# Patient Record
Sex: Male | Born: 1950 | Race: White | Hispanic: No | Marital: Single | Smoking: Current every day smoker
Health system: Southern US, Community
[De-identification: ages and names within clinical notes are randomized; demographics above are authoritative.]

## PROBLEM LIST (undated history)

## (undated) DIAGNOSIS — I10 Essential (primary) hypertension: Secondary | ICD-10-CM

---

## 1999-11-09 ENCOUNTER — Emergency Department (HOSPITAL_COMMUNITY): Admission: EM | Admit: 1999-11-09 | Discharge: 1999-11-09 | Payer: Self-pay | Admitting: Emergency Medicine

## 2002-09-22 ENCOUNTER — Encounter: Admission: RE | Admit: 2002-09-22 | Discharge: 2002-09-22 | Payer: Self-pay | Admitting: Family Medicine

## 2002-09-22 ENCOUNTER — Encounter: Payer: Self-pay | Admitting: Family Medicine

## 2002-10-24 ENCOUNTER — Encounter: Payer: Self-pay | Admitting: Family Medicine

## 2002-10-27 ENCOUNTER — Encounter: Admission: RE | Admit: 2002-10-27 | Discharge: 2002-10-27 | Payer: Self-pay | Admitting: Family Medicine

## 2002-10-27 ENCOUNTER — Encounter: Payer: Self-pay | Admitting: Family Medicine

## 2004-10-31 ENCOUNTER — Encounter: Admission: RE | Admit: 2004-10-31 | Discharge: 2004-10-31 | Payer: Self-pay | Admitting: Family Medicine

## 2004-10-31 ENCOUNTER — Ambulatory Visit: Payer: Self-pay | Admitting: Family Medicine

## 2004-11-14 ENCOUNTER — Ambulatory Visit: Payer: Self-pay | Admitting: Family Medicine

## 2005-01-17 ENCOUNTER — Ambulatory Visit: Payer: Self-pay | Admitting: Family Medicine

## 2005-02-07 ENCOUNTER — Inpatient Hospital Stay (HOSPITAL_COMMUNITY): Admission: AC | Admit: 2005-02-07 | Discharge: 2005-02-10 | Payer: Self-pay | Admitting: Emergency Medicine

## 2005-05-01 ENCOUNTER — Encounter: Admission: RE | Admit: 2005-05-01 | Discharge: 2005-05-01 | Payer: Self-pay | Admitting: Neurosurgery

## 2006-01-06 IMAGING — CR DG CHEST 2V
3 series · 3 of 3 positions shown · non-contrast
Comparison: none

CLINICAL DATA: Followup apical pleural thickening.  
 CHEST ? TWO VIEW:
 PA and lateral views of the chest are made and are compared to previous CT scans of 10/27/02 and a chest film of 09/22/02 and shows stable bilateral apical scarring which again is seen to be more marked on the right than the left.  There is diffuse peribronchial thickening.  The heart, mediastinum bony thorax and soft tissues are within the limits of normal.

[view not recorded (1 of 3)]
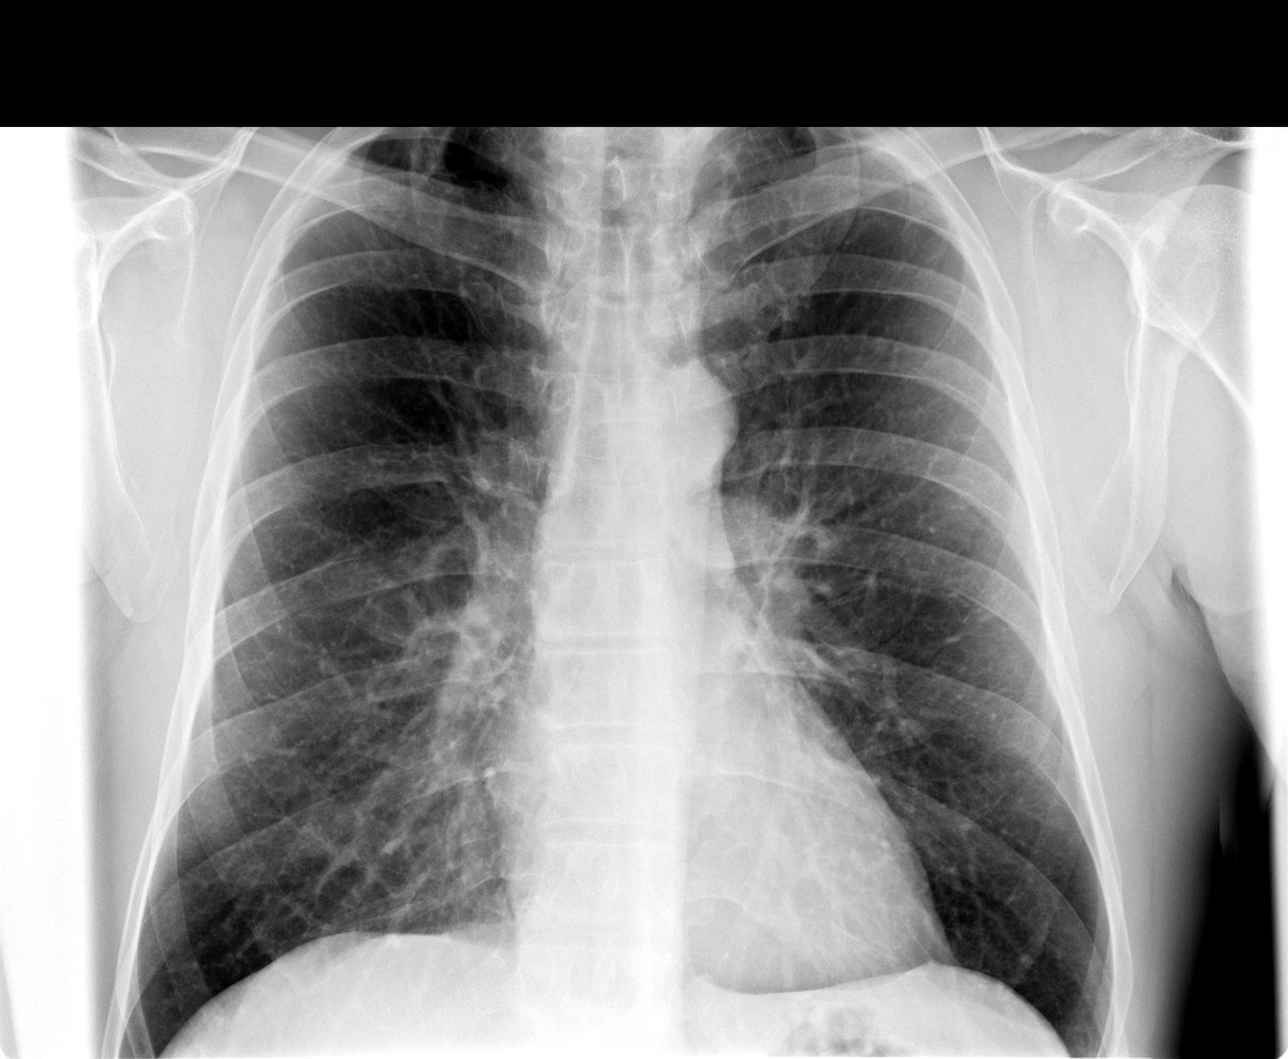

[view not recorded (2 of 3)]
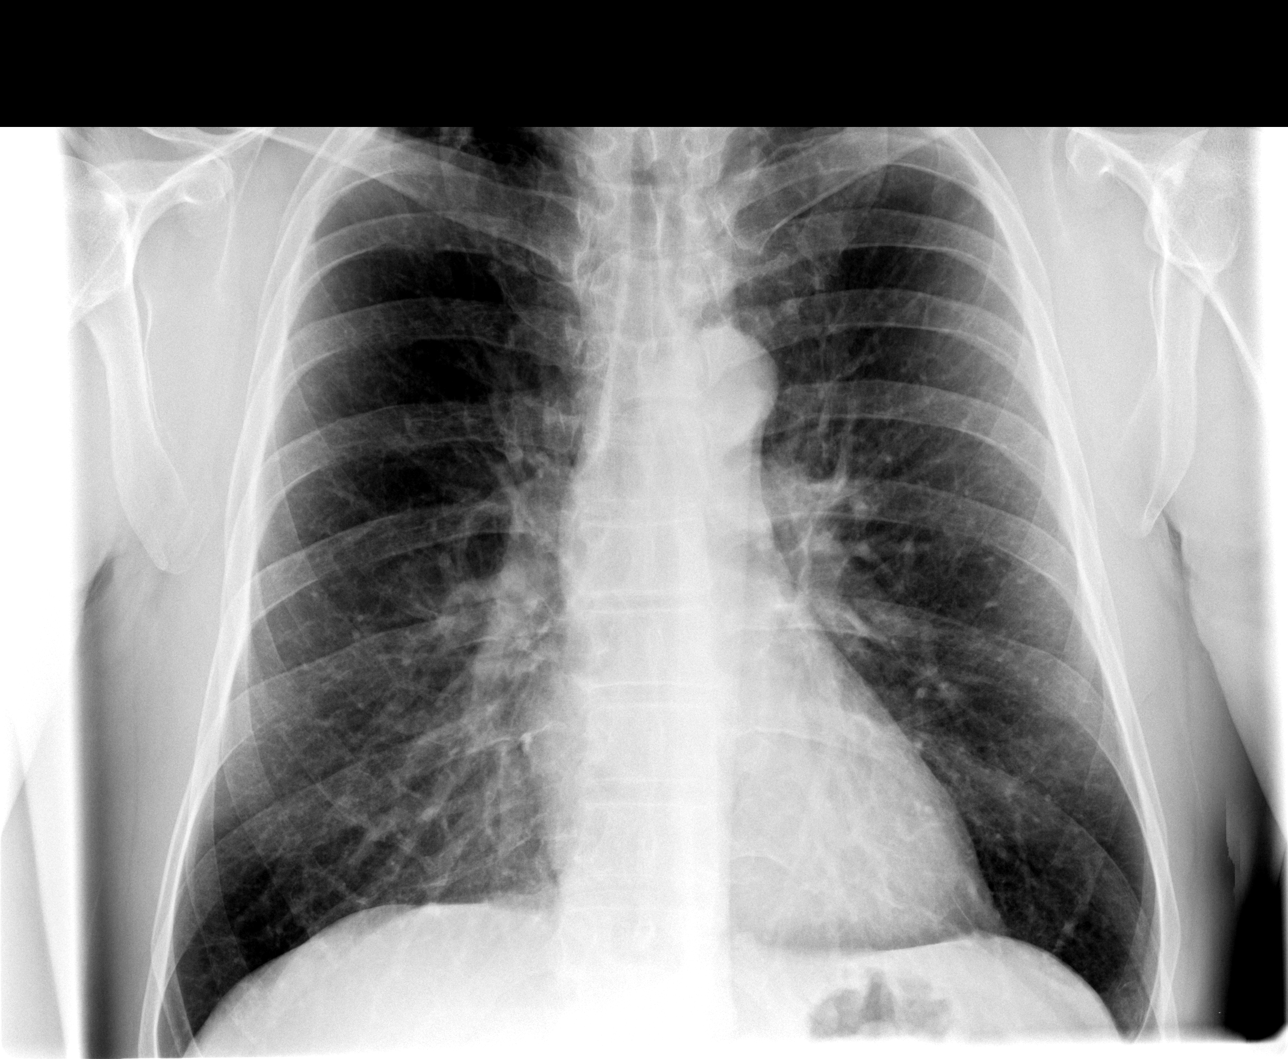

[view not recorded (3 of 3)]
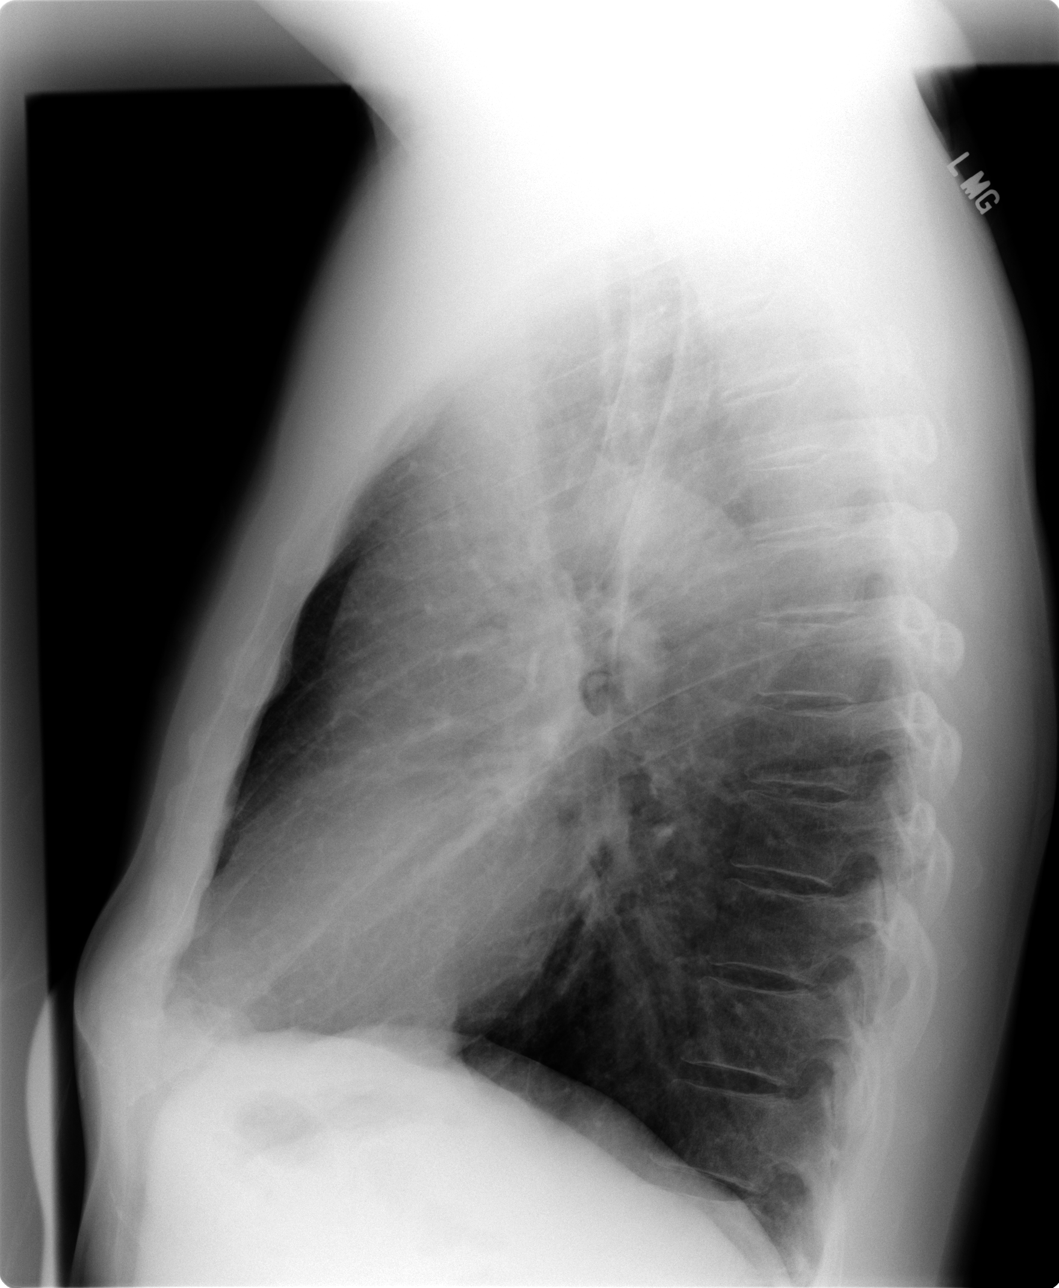

[3 of 3 positions shown; findings below may reference images not displayed]

IMPRESSION: Bilateral apical scarring right greater than left but unchanged since previous studies.  Diffuse peribronchial thickening.  Heart is normal.

## 2006-01-29 ENCOUNTER — Ambulatory Visit: Payer: Self-pay | Admitting: Family Medicine
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
## 2007-01-23 ENCOUNTER — Ambulatory Visit: Payer: Self-pay | Admitting: Family Medicine

## 2007-01-23 LAB — CONVERTED CEMR LAB
Albumin: 3.7 g/dL (ref 3.5–5.2)
Anti Nuclear Antibody(ANA): NEGATIVE
Basophils Relative: 0.9 % (ref 0.0–1.0)
Bilirubin, Direct: 0.1 mg/dL (ref 0.0–0.3)
CO2: 30 meq/L (ref 19–32)
Chloride: 105 meq/L (ref 96–112)
Folate: 8.4 ng/mL
Glucose, Bld: 94 mg/dL (ref 70–99)
HCT: 49.2 % (ref 39.0–52.0)
Hemoglobin: 17 g/dL (ref 13.0–17.0)
Lymphocytes Relative: 24.4 % (ref 12.0–46.0)
MCHC: 34.6 g/dL (ref 30.0–36.0)
Monocytes Relative: 10.4 % (ref 3.0–11.0)
Neutro Abs: 4.4 10*3/uL (ref 1.4–7.7)
Phosphorus: 3.5 mg/dL (ref 2.3–4.6)
RDW: 12.7 % (ref 11.5–14.6)
Total Bilirubin: 0.6 mg/dL (ref 0.3–1.2)
Vitamin B-12: 267 pg/mL (ref 211–911)

## 2007-02-01 ENCOUNTER — Encounter: Payer: Self-pay | Admitting: Family Medicine

## 2007-02-01 DIAGNOSIS — IMO0001 Reserved for inherently not codable concepts without codable children: Secondary | ICD-10-CM

## 2007-02-01 DIAGNOSIS — I10 Essential (primary) hypertension: Secondary | ICD-10-CM | POA: Insufficient documentation

## 2007-02-01 DIAGNOSIS — F1010 Alcohol abuse, uncomplicated: Secondary | ICD-10-CM | POA: Insufficient documentation

## 2007-02-01 DIAGNOSIS — F411 Generalized anxiety disorder: Secondary | ICD-10-CM | POA: Insufficient documentation

## 2007-02-01 DIAGNOSIS — M7020 Olecranon bursitis, unspecified elbow: Secondary | ICD-10-CM | POA: Insufficient documentation

## 2007-02-11 ENCOUNTER — Ambulatory Visit: Payer: Self-pay | Admitting: Family Medicine

## 2007-02-11 DIAGNOSIS — F17200 Nicotine dependence, unspecified, uncomplicated: Secondary | ICD-10-CM

## 2007-02-11 DIAGNOSIS — F341 Dysthymic disorder: Secondary | ICD-10-CM | POA: Insufficient documentation

## 2009-01-25 ENCOUNTER — Ambulatory Visit: Payer: Self-pay | Admitting: Family Medicine

## 2009-01-25 DIAGNOSIS — J309 Allergic rhinitis, unspecified: Secondary | ICD-10-CM | POA: Insufficient documentation

## 2009-01-29 ENCOUNTER — Ambulatory Visit: Payer: Self-pay | Admitting: Family Medicine

## 2009-04-29 ENCOUNTER — Ambulatory Visit: Payer: Self-pay | Admitting: Family Medicine

## 2009-05-03 ENCOUNTER — Ambulatory Visit: Payer: Self-pay | Admitting: Family Medicine

## 2009-05-05 LAB — CONVERTED CEMR LAB
AST: 37 units/L (ref 0–37)
Albumin: 3.8 g/dL (ref 3.5–5.2)
BUN: 11 mg/dL (ref 6–23)
CO2: 30 meq/L (ref 19–32)
Calcium: 9 mg/dL (ref 8.4–10.5)
Cholesterol: 147 mg/dL (ref 0–200)
Eosinophils Absolute: 0.3 10*3/uL (ref 0.0–0.7)
Eosinophils Relative: 4.4 % (ref 0.0–5.0)
Glucose, Bld: 93 mg/dL (ref 70–99)
HCT: 51.1 % (ref 39.0–52.0)
Hemoglobin: 17.2 g/dL — ABNORMAL HIGH (ref 13.0–17.0)
LDL Cholesterol: 97 mg/dL (ref 0–99)
Lymphocytes Relative: 26.9 % (ref 12.0–46.0)
MCV: 93.7 fL (ref 78.0–100.0)
Monocytes Absolute: 0.7 10*3/uL (ref 0.1–1.0)
Monocytes Relative: 10.6 % (ref 3.0–12.0)
Neutrophils Relative %: 56.8 % (ref 43.0–77.0)
Potassium: 4.9 meq/L (ref 3.5–5.1)
RDW: 12.8 % (ref 11.5–14.6)
Sodium: 141 meq/L (ref 135–145)
Total CHOL/HDL Ratio: 4
Total Protein: 7.8 g/dL (ref 6.0–8.3)
VLDL: 13.6 mg/dL (ref 0.0–40.0)

## 2009-05-14 ENCOUNTER — Ambulatory Visit: Payer: Self-pay | Admitting: Family Medicine

## 2009-05-14 LAB — CONVERTED CEMR LAB
OCCULT 2: NEGATIVE
OCCULT 3: NEGATIVE

## 2009-05-17 ENCOUNTER — Encounter (INDEPENDENT_AMBULATORY_CARE_PROVIDER_SITE_OTHER): Payer: Self-pay | Admitting: *Deleted

## 2009-07-13 ENCOUNTER — Ambulatory Visit: Payer: Self-pay | Admitting: Family Medicine

## 2009-08-24 ENCOUNTER — Ambulatory Visit: Payer: Self-pay | Admitting: Family Medicine

## 2009-11-18 ENCOUNTER — Encounter: Payer: Self-pay | Admitting: Family Medicine

## 2010-02-04 ENCOUNTER — Ambulatory Visit: Payer: Self-pay | Admitting: Family Medicine

## 2010-02-05 ENCOUNTER — Encounter: Payer: Self-pay | Admitting: Family Medicine

## 2010-02-07 LAB — CONVERTED CEMR LAB
ALT: 26 units/L (ref 0–53)
AST: 31 units/L (ref 0–37)
Albumin: 3.9 g/dL (ref 3.5–5.2)
BUN: 10 mg/dL (ref 6–23)
Basophils Relative: 0.9 % (ref 0.0–3.0)
CO2: 31 meq/L (ref 19–32)
Calcium: 9.6 mg/dL (ref 8.4–10.5)
Chloride: 104 meq/L (ref 96–112)
Cholesterol: 170 mg/dL (ref 0–200)
Creatinine, Ser: 1 mg/dL (ref 0.4–1.5)
Eosinophils Absolute: 0.2 10*3/uL (ref 0.0–0.7)
Eosinophils Relative: 3.6 % (ref 0.0–5.0)
GFR calc non Af Amer: 80.8 mL/min (ref >60)
Glucose, Bld: 92 mg/dL (ref 70–99)
HCT: 48.2 % (ref 39.0–52.0)
Hemoglobin: 16.9 g/dL (ref 13.0–17.0)
Lymphocytes Relative: 30.1 % (ref 12.0–46.0)
Monocytes Absolute: 0.8 10*3/uL (ref 0.1–1.0)
Monocytes Relative: 11.7 % (ref 3.0–12.0)
Phosphorus: 4.2 mg/dL (ref 2.3–4.6)
TSH: 1.76 microintl units/mL (ref 0.35–5.50)
Total CHOL/HDL Ratio: 4
Triglycerides: 85 mg/dL (ref 0.0–149.0)

## 2010-02-11 DIAGNOSIS — B1710 Acute hepatitis C without hepatic coma: Secondary | ICD-10-CM

## 2010-02-11 LAB — CONVERTED CEMR LAB: HCV Ab: REACTIVE — AB

## 2010-02-22 ENCOUNTER — Encounter: Payer: Self-pay | Admitting: Family Medicine

## 2010-03-31 ENCOUNTER — Emergency Department (HOSPITAL_COMMUNITY): Admission: EM | Admit: 2010-03-31 | Discharge: 2010-04-01 | Payer: Self-pay | Admitting: Emergency Medicine

## 2010-05-20 ENCOUNTER — Encounter: Payer: Self-pay | Admitting: Family Medicine

## 2010-08-09 ENCOUNTER — Ambulatory Visit: Payer: Self-pay | Admitting: Family Medicine

## 2010-11-10 NOTE — Consult Note (Signed)
Summary: [facility]   Imported By: Lanelle Bal 03/01/2010 11:12:04  _____________________________________________________________________  External Attachment:    Type:   Image     Comment:   External Document
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
## 2010-11-10 NOTE — Letter (Signed)
Summary: [facility] [place]   Imported By: Maryln Gottron 05/27/2010 13:49:52  _____________________________________________________________________  External Attachment:    Type:   Image     Comment:   External Document  Appended Document: [facility]    Clinical Lists Changes  Observations: Added new observation of PAST MED HX: Anxiety Hypertension [facility] dx with chronic hep C smoker    ID - Dr Blocker (05/29/2010 21:18)       Past Medical History:    Anxiety    Hypertension    [facility]    dx with chronic hep C    smoker             ID - Dr Leavy Cella

## 2010-12-25 LAB — DIFFERENTIAL
Basophils Relative: 1 % (ref 0–1)
Eosinophils Relative: 4 % (ref 0–5)
Lymphocytes Relative: 28 % (ref 12–46)
Lymphs Abs: 2.1 10*3/uL (ref 0.7–4.0)
Monocytes Relative: 9 % (ref 3–12)
Neutro Abs: 4.4 10*3/uL (ref 1.7–7.7)

## 2010-12-25 LAB — COMPREHENSIVE METABOLIC PANEL
ALT: 27 U/L (ref 0–53)
AST: 36 U/L (ref 0–37)
Albumin: 3.8 g/dL (ref 3.5–5.2)
Alkaline Phosphatase: 71 U/L (ref 39–117)
Calcium: 8.7 mg/dL (ref 8.4–10.5)
Chloride: 108 mEq/L (ref 96–112)
Creatinine, Ser: 0.96 mg/dL (ref 0.4–1.5)
Glucose, Bld: 85 mg/dL (ref 70–99)
Potassium: 3.4 mEq/L — ABNORMAL LOW (ref 3.5–5.1)
Total Bilirubin: 0.5 mg/dL (ref 0.3–1.2)
Total Protein: 7.3 g/dL (ref 6.0–8.3)

## 2010-12-25 LAB — PROTIME-INR: INR: 1.04 (ref 0.00–1.49)

## 2010-12-25 LAB — APTT: aPTT: 31 seconds (ref 24–37)

## 2010-12-25 LAB — CBC: WBC: 7.5 10*3/uL (ref 4.0–10.5)
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
## 2016-11-16 ENCOUNTER — Telehealth: Payer: Self-pay

## 2016-11-16 NOTE — Telephone Encounter (Signed)
Pt states he [facility].

## 2021-06-20 ENCOUNTER — Emergency Department (HOSPITAL_BASED_OUTPATIENT_CLINIC_OR_DEPARTMENT_OTHER)
Admission: EM | Admit: 2021-06-20 | Discharge: 2021-06-20 | Disposition: A | Discharge status: Home / Self Care | Payer: Medicare Other | Attending: Emergency Medicine | Admitting: Emergency Medicine

## 2021-06-20 ENCOUNTER — Other Ambulatory Visit: Payer: Self-pay

## 2021-06-20 ENCOUNTER — Encounter (HOSPITAL_BASED_OUTPATIENT_CLINIC_OR_DEPARTMENT_OTHER): Payer: Self-pay | Admitting: *Deleted

## 2021-06-20 ENCOUNTER — Emergency Department (HOSPITAL_BASED_OUTPATIENT_CLINIC_OR_DEPARTMENT_OTHER): Payer: Medicare Other | Admitting: Radiology

## 2021-06-20 ENCOUNTER — Emergency Department (HOSPITAL_BASED_OUTPATIENT_CLINIC_OR_DEPARTMENT_OTHER): Payer: Medicare Other

## 2021-06-20 DIAGNOSIS — I10 Essential (primary) hypertension: Secondary | ICD-10-CM | POA: Diagnosis not present

## 2021-06-20 DIAGNOSIS — I4891 Unspecified atrial fibrillation: Secondary | ICD-10-CM | POA: Insufficient documentation

## 2021-06-20 DIAGNOSIS — H9193 Unspecified hearing loss, bilateral: Secondary | ICD-10-CM | POA: Insufficient documentation

## 2021-06-20 DIAGNOSIS — R519 Headache, unspecified: Secondary | ICD-10-CM | POA: Insufficient documentation

## 2021-06-20 DIAGNOSIS — R0981 Nasal congestion: Secondary | ICD-10-CM | POA: Insufficient documentation

## 2021-06-20 DIAGNOSIS — Z79899 Other long term (current) drug therapy: Secondary | ICD-10-CM | POA: Diagnosis not present

## 2021-06-20 DIAGNOSIS — Z7901 Long term (current) use of anticoagulants: Secondary | ICD-10-CM | POA: Insufficient documentation

## 2021-06-20 DIAGNOSIS — Z8673 Personal history of transient ischemic attack (TIA), and cerebral infarction without residual deficits: Secondary | ICD-10-CM

## 2021-06-20 DIAGNOSIS — F17210 Nicotine dependence, cigarettes, uncomplicated: Secondary | ICD-10-CM | POA: Diagnosis not present

## 2021-06-20 HISTORY — DX: Essential (primary) hypertension: I10

## 2021-06-20 LAB — COMPREHENSIVE METABOLIC PANEL
ALT: 17 U/L (ref 0–44)
AST: 21 U/L (ref 15–41)
Albumin: 4.4 g/dL (ref 3.5–5.0)
Alkaline Phosphatase: 66 U/L (ref 38–126)
Anion gap: 10 (ref 5–15)
BUN: 17 mg/dL (ref 8–23)
CO2: 25 mmol/L (ref 22–32)
Calcium: 9.7 mg/dL (ref 8.9–10.3)
Chloride: 105 mmol/L (ref 98–111)
Creatinine, Ser: 1.04 mg/dL (ref 0.61–1.24)
GFR, Estimated: >60 mL/min (ref >60)
Glucose, Bld: 109 mg/dL — ABNORMAL HIGH (ref 70–99)
Potassium: 3.6 mmol/L (ref 3.5–5.1)
Sodium: 140 mmol/L (ref 135–145)
Total Bilirubin: 0.5 mg/dL (ref 0.3–1.2)
Total Protein: 7.9 g/dL (ref 6.5–8.1)

## 2021-06-20 LAB — TSH: TSH: 2.821 u[IU]/mL (ref 0.350–4.500)

## 2021-06-20 LAB — CBC WITH DIFFERENTIAL/PLATELET
Abs Immature Granulocytes: 0.02 10*3/uL (ref 0.00–0.07)
Basophils Absolute: 0 10*3/uL (ref 0.0–0.1)
Basophils Relative: 1 %
Eosinophils Absolute: 0.2 10*3/uL (ref 0.0–0.5)
Eosinophils Relative: 3 %
HCT: 47.8 % (ref 39.0–52.0)
Hemoglobin: 16.2 g/dL (ref 13.0–17.0)
Immature Granulocytes: 0 %
Lymphocytes Relative: 22 %
Lymphs Abs: 1.8 10*3/uL (ref 0.7–4.0)
MCH: 30.7 pg (ref 26.0–34.0)
MCHC: 33.9 g/dL (ref 30.0–36.0)
MCV: 90.5 fL (ref 80.0–100.0)
Monocytes Absolute: 0.8 10*3/uL (ref 0.1–1.0)
Monocytes Relative: 9 %
Neutro Abs: 5.4 10*3/uL (ref 1.7–7.7)
Neutrophils Relative %: 65 %
Platelets: 299 10*3/uL (ref 150–400)
RBC: 5.28 MIL/uL (ref 4.22–5.81)
RDW: 13.5 % (ref 11.5–15.5)
WBC: 8.3 10*3/uL (ref 4.0–10.5)
nRBC: 0 % (ref 0.0–0.2)

## 2021-06-20 LAB — MAGNESIUM: Magnesium: 2.1 mg/dL (ref 1.7–2.4)

## 2021-06-20 LAB — TROPONIN I (HIGH SENSITIVITY): Troponin I (High Sensitivity): 14 ng/L (ref <18)

## 2021-06-20 MED ORDER — FLUTICASONE PROPIONATE 50 MCG/ACT NA SUSP
2.0000 | Freq: Every day | NASAL | Status: DC
  Administered 2021-06-20: 2 via NASAL
  Filled 2021-06-20: qty 16

## 2021-06-20 MED ORDER — CEFDINIR 300 MG PO CAPS: 300.0000 mg | ORAL_CAPSULE | Freq: Two times a day (BID) | ORAL | 0 refills | Status: AC

## 2021-06-20 MED ORDER — ACETAMINOPHEN 325 MG PO TABS
650.0000 mg | ORAL_TABLET | Freq: Once | ORAL | Status: AC
  Administered 2021-06-20: 650 mg via ORAL
  Filled 2021-06-20: qty 2

## 2021-06-20 MED ORDER — RIVAROXABAN 15 MG PO TABS
15.0000 mg | ORAL_TABLET | Freq: Once | ORAL | Status: AC
  Administered 2021-06-20: 15 mg via ORAL
  Filled 2021-06-20: qty 1

## 2021-06-20 MED ORDER — RIVAROXABAN 20 MG PO TABS: 20.0000 mg | ORAL_TABLET | Freq: Every day | ORAL | 0 refills | Status: AC

## 2021-06-20 NOTE — Discharge Instructions (Addendum)
You were given a prescription for antibiotics to treat a suspected sinus infection.  Please take the antibiotic prescription fully. Take two sprays of the fluticasone daily for nasal congestion as well.   Because of the changes in your hearing, you have been given a referral to an ear nose and throat doctor.  Please call the office to schedule an appointment for follow-up.  You were also noted to be in atrial fibrillation.  Because of this you were started on a blood thinning medication.  Please take this medication as directed.  You will be given an ambulatory referral to the atrial fibrillation clinic.  The office will reach out to you to schedule an appointment for follow-up.  Alternatively you can follow-up with cardiology at the [facility] if you prefer.  On the CT scan of your head you were noted to have removed stroke that was likely mild.  It is recommended that you follow-up with neurology.  You were given an ambulatory referral to neurology within the [facility] and they will reach out to you to schedule an appointment unless [facility] and in that case she will need to call the [facility] to [facility] an [facility] for [facility].  If you have any new or worsening symptoms in the meantime please return to the emergency department immediately.

## 2021-06-20 NOTE — ED Triage Notes (Signed)
Pt stated that he had covid in August with flu-like symptoms and head feeling full or congested.  Head fullness/congestion never got resolved and is getting worst and he is losing his hearing on his right ear (hx of tinnitus).

## 2021-06-20 NOTE — ED Notes (Signed)
Removed IV per Cortni - PA

## 2021-06-20 NOTE — ED Provider Notes (Signed)
[facility] EMERGENCY DEPT Provider Note   CSN: [id number] Arrival date & time: 06/20/21  7867     History Chief Complaint  Patient presents with   Head Congestion    Ronald Wagner is a 70 y.o. male.  HPI   Pt is a 70 y/o male with a h/o HTN who presents to the ED today for eval of head congestion that has been ongoing since he was dx with covid last month. Sxs worsened about 5 days ago.  He describes the discomfort as a rubber band that is wrapped around his head and states that his head just feels full. He denies any exacerbating or alleviating factors. Denies any current lightheadedness/dizziness or vision changes. No reported unilateral numbness/weakness. He has some associated nasal congestion, postnasal drip, and hearing loss which he feels is worse in his right ear but is present bilaterally.  States when he pops his left ear seems to help with his hearing but he is unable to relieve symptoms in the right ear.  He further reports some tightness in the anterior neck/upper chest and in the posterior chest.  He reports some elevated heart rates in the last few weeks.  He denies any chest pain or shortness of breath.  He denies any recent medication changes other than being started on chlorthalidone in July.   Past Medical History:  Diagnosis Date   Hypertension     Patient Active Problem List   Diagnosis Date Noted   HEPATITIS C 02/11/2010   ALLERGIC RHINITIS 01/25/2009   ANXIETY DEPRESSION 02/11/2007   DISORDER, TOBACCO USE 02/11/2007   ANXIETY 02/01/2007   ALCOHOL ABUSE 02/01/2007   HYPERTENSION 02/01/2007   OLECRANON BURSITIS, RIGHT 02/01/2007   MYALGIA 02/01/2007    History reviewed. No pertinent surgical history.     History reviewed. No pertinent family history.  Social History   Tobacco Use   Smoking status: Every Day    Packs/day: 1.50    Types: Cigarettes   Smokeless tobacco: Never  Vaping Use   Vaping Use: Never used  Substance Use  Topics   Alcohol use: Yes    Comment: 3-4 beers a day   Drug use: Not Currently    Types: Marijuana    Home Medications Prior to Admission medications   Medication Sig Start Date End Date Taking? Authorizing Provider  atorvastatin (LIPITOR) 10 MG tablet Take 10 mg by mouth at bedtime.   Yes [provider]  cefdinir (OMNICEF) 300 MG capsule Take 1 capsule (300 mg total) by mouth 2 (two) times daily for 10 days. 06/20/21 06/30/21 Yes Rashard Ryle S, PA-C  chlorthalidone (HYGROTON) 25 MG tablet Take 25 mg by mouth daily. Take one-half tab daily.  Hold for BP < 100   Yes [provider]  rivaroxaban (XARELTO) 20 MG TABS tablet Take 1 tablet (20 mg total) by mouth daily with supper. 06/20/21  Yes Maddilyn Campus S, PA-C    Allergies    Penicillins  Review of Systems   Review of Systems  Constitutional:  Negative for fever.  HENT:  Positive for congestion, postnasal drip and rhinorrhea.   Respiratory:  Negative for shortness of breath.   Cardiovascular:  Negative for chest pain.  Gastrointestinal:  Negative for abdominal pain and diarrhea.  Genitourinary:  Negative for flank pain.  Musculoskeletal:  Negative for back pain.  Neurological:  Positive for headaches. Negative for weakness and numbness.   Physical Exam Updated Vital Signs BP 119/83 (BP Location: Right Arm)  Pulse 65   Temp 98.1 F (36.7 C) (Oral)   Resp 20   Ht 6' (1.829 m)   Wt 93 kg   SpO2 99%   BMI 27.80 kg/m   Physical Exam Vitals and nursing note reviewed.  Constitutional:      Appearance: He is well-developed.  HENT:     Head: Normocephalic and atraumatic.     Right Ear: Tympanic membrane normal.     Left Ear: Tympanic membrane normal.     Mouth/Throat:     Mouth: Mucous membranes are moist.  Eyes:     Extraocular Movements: Extraocular movements intact.     Conjunctiva/sclera: Conjunctivae normal.     Pupils: Pupils are equal, round, and reactive to light.  Cardiovascular:      Rate and Rhythm: Normal rate and regular rhythm.     Heart sounds: Normal heart sounds. No murmur heard. Pulmonary:     Effort: Pulmonary effort is normal. No respiratory distress.     Breath sounds: Normal breath sounds. No wheezing, rhonchi or rales.  Abdominal:     General: Bowel sounds are normal.     Palpations: Abdomen is soft.     Tenderness: There is no abdominal tenderness. There is no guarding or rebound.  Musculoskeletal:     Cervical back: Neck supple.  Skin:    General: Skin is warm and dry.  Neurological:     Mental Status: He is alert.     Comments: Mental Status:  Alert, thought content appropriate, able to give a coherent history. Speech fluent without evidence of aphasia. Able to follow 2 step commands without difficulty.  Cranial Nerves:  II:  pupils equal, round, reactive to light III,IV, VI: ptosis not present, extra-ocular motions intact bilaterally  V,VII: smile symmetric, facial light touch sensation equal VIII: hearing grossly normal to voice  X: uvula elevates symmetrically  XI: bilateral shoulder shrug symmetric and strong XII: midline tongue extension without fassiculations Motor:  Normal tone. 5/5 strength of BUE and BLE major muscle groups including strong and equal grip strength and dorsiflexion/plantar flexion Sensory: light touch normal in all extremities. Gait: normal gait and balance.      ED Results / Procedures / Treatments   Labs (all labs ordered are listed, but only abnormal results are displayed) Labs Reviewed  COMPREHENSIVE METABOLIC PANEL - Abnormal; Notable for the following components:      Result Value   Glucose, Bld 109 (*)    All other components within normal limits  CBC WITH DIFFERENTIAL/PLATELET  TSH  MAGNESIUM  TROPONIN I (HIGH SENSITIVITY)    EKG EKG Interpretation  Date/Time:  Monday June 20 2021 11:52:54 EDT Ventricular Rate:  81 PR Interval:    QRS Duration: 100 QT Interval:  382 QTC Calculation: 433 R  Axis:   -14 Text Interpretation: Atrial flutter Atrial flutter, no previous Confirmed by Coralee PesaHorton, Kristie [phone]([id number]) on 06/20/2021 12:40:52 PM  Radiology DG Chest 2 View  Result Date: 06/20/2021 CLINICAL DATA:  Chest pain. EXAM: CHEST - 2 VIEW COMPARISON:  Chest radiograph, 04/30/2010. FINDINGS: Cardiomediastinal silhouette is within normal limits. Relative hypoinflation MVA no focal consolidation or mass. No pleural effusion or pneumothorax. Bilateral AC joint degenerative change. No acute displaced fracture. IMPRESSION: Obstructive lung disease without acute superimposed cardiopulmonary process. Electronically Signed   By: Roanna BanningJon  Mugweru M.D.   On: 06/20/2021 13:24   CT HEAD WO CONTRAST (5MM)  Result Date: 06/20/2021 CLINICAL DATA:  Headache.  COVID-19 in August. EXAM: CT HEAD WITHOUT CONTRAST  TECHNIQUE: Contiguous axial images were obtained from the base of the skull through the vertex without intravenous contrast. COMPARISON:  02/07/2005 FINDINGS: Brain: No mass lesion, hemorrhage, hydrocephalus, intra-axial, or extra-axial fluid collection. Favor mild small vessel ischemic change versus remote infarct in the right frontal periventricular white matter including on 16/2 and coronal image 40. Vascular: No hyperdense vessel or unexpected calcification. Skull: Normal Sinuses/Orbits: Surgical changes about both globes. Fluid in the left maxillary sinus. Partial opacification of bilateral mastoid air cells. Other: None. IMPRESSION: 1. Right frontal lobe periventricular white matter hypoattenuation is favored to be related to remote lacunar infarct or chronic small vessel ischemic change. If there are localizing symptoms in this area, consider MRI to exclude subacute infarct. 2. Otherwise, no acute intracranial abnormality. 3. Sinus disease with bilateral mastoid effusions. Electronically Signed   By: Jeronimo Greaves M.D.   On: 06/20/2021 13:25    Procedures Procedures   Medications Ordered in ED Medications   fluticasone (FLONASE) 50 MCG/ACT nasal spray 2 spray (2 sprays Each Nare Given 06/20/21 1313)  acetaminophen (TYLENOL) tablet 650 mg (650 mg Oral Given 06/20/21 1313)  Rivaroxaban (XARELTO) tablet 15 mg (15 mg Oral Given 06/20/21 1726)    ED Course  I have reviewed the triage vital signs and the nursing notes.  Pertinent labs & imaging results that were available during my care of the patient were reviewed by me and considered in my medical decision making (see chart for details).    MDM Rules/Calculators/A&P                          70 y/o male presents for eval of headache for several weeks. Also c/o anterior/posterior neck pain and upper chest tightness/discomfort.   Reviewed/interpreted labs CBC wnl CMP unremarkable TSH wnl Trop neg Mag wnl  EKG - Atrial flutter Atrial flutter, no previous  Reviewed/interpreted imaging CXR - Obstructive lung disease without acute superimposed cardiopulmonary process.  CT head -  1. Right frontal lobe periventricular white matter hypoattenuation is favored to be related to remote lacunar infarct or chronic small vessel ischemic change. If there are localizing symptoms in this area, consider MRI to exclude subacute infarct. 2. Otherwise, no acute intracranial abnormality. 3. Sinus disease with bilateral mastoid effusions.   Mdm: Patient with head congestion, decreased hearing and sinus congestion.  Suspect his symptoms are likely related to sinusitis.  Given duration of symptoms patient was given Rx for fluticasone as well as Augmentin to treat potential bacterial sinusitis.  Due to his reported decreased hearing we will also refer him to ENT should this persist despite treatment for sinusitis.  He was incidentally noted to be in atrial fibrillation during his visit in the ED.  He is asymptomatic of this and has been rate controlled throughout his entire stay.  His CHA2DS2-VASc score is 1 and therefore he was anticoagulated with Xarelto.  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  I discussed the case with neurology on-call @ 3:51 PM, Dr. Thomasena Edis, who feels comfortable that the patient can have a work-up as an outpatient given the duration of his symptoms and the fact that the infarct is likely chronic in nature.  He does not appear to have any emergent cause of his symptoms today that would require further work-up or admission to the hospital.  Patient was given referrals to ENT, neurology and cardiology.  He is advised to follow-up accordingly with either the referrals given or  with specialist within the [place] system.  He is advised to return to the emergency department for any new or worsening symptoms in the meantime.  All questions were answered peer patient stable for discharge.   Final Clinical Impression(s) / ED Diagnoses Final diagnoses:  Head congestion  Atrial fibrillation, unspecified type (HCC)  Bilateral hearing loss, unspecified hearing loss type  Remote history of stroke    Rx / DC Orders ED Discharge Orders          Ordered    cefdinir (OMNICEF) 300 MG capsule  2 times daily        06/20/21 1628    rivaroxaban (XARELTO) 20 MG TABS tablet  Daily with supper        06/20/21 1628    Amb Referral to [facility]        06/20/21 1628    Ambulatory referral to Neurology       Comments: An appointment is requested in approximately: 1 week   06/20/21 1628             Karrie Meres, PA-C 06/20/21 2141    Rozelle Logan, DO 06/21/21 1500

## 2021-10-03 ENCOUNTER — Emergency Department (HOSPITAL_BASED_OUTPATIENT_CLINIC_OR_DEPARTMENT_OTHER): Payer: Medicare Other

## 2021-10-03 ENCOUNTER — Encounter (HOSPITAL_BASED_OUTPATIENT_CLINIC_OR_DEPARTMENT_OTHER): Payer: Self-pay | Admitting: Obstetrics and Gynecology

## 2021-10-03 ENCOUNTER — Emergency Department (HOSPITAL_BASED_OUTPATIENT_CLINIC_OR_DEPARTMENT_OTHER)
Admission: EM | Admit: 2021-10-03 | Discharge: 2021-10-03 | Disposition: A | Discharge status: Home / Self Care | Payer: Medicare Other | Attending: Emergency Medicine | Admitting: Emergency Medicine

## 2021-10-03 ENCOUNTER — Other Ambulatory Visit: Payer: Self-pay

## 2021-10-03 DIAGNOSIS — Z7901 Long term (current) use of anticoagulants: Secondary | ICD-10-CM | POA: Insufficient documentation

## 2021-10-03 DIAGNOSIS — R1030 Lower abdominal pain, unspecified: Secondary | ICD-10-CM | POA: Insufficient documentation

## 2021-10-03 DIAGNOSIS — R102 Pelvic and perineal pain: Secondary | ICD-10-CM | POA: Insufficient documentation

## 2021-10-03 DIAGNOSIS — I10 Essential (primary) hypertension: Secondary | ICD-10-CM | POA: Diagnosis not present

## 2021-10-03 DIAGNOSIS — F17210 Nicotine dependence, cigarettes, uncomplicated: Secondary | ICD-10-CM | POA: Insufficient documentation

## 2021-10-03 LAB — COMPREHENSIVE METABOLIC PANEL
ALT: 17 U/L (ref 0–44)
AST: 18 U/L (ref 15–41)
Albumin: 4.1 g/dL (ref 3.5–5.0)
Alkaline Phosphatase: 74 U/L (ref 38–126)
Anion gap: 8 (ref 5–15)
BUN: 16 mg/dL (ref 8–23)
CO2: 30 mmol/L (ref 22–32)
Calcium: 9.6 mg/dL (ref 8.9–10.3)
Chloride: 99 mmol/L (ref 98–111)
Creatinine, Ser: 1.12 mg/dL (ref 0.61–1.24)
GFR, Estimated: >60 mL/min (ref >60)
Glucose, Bld: 149 mg/dL — ABNORMAL HIGH (ref 70–99)
Potassium: 3.4 mmol/L — ABNORMAL LOW (ref 3.5–5.1)
Sodium: 137 mmol/L (ref 135–145)
Total Bilirubin: 0.5 mg/dL (ref 0.3–1.2)
Total Protein: 7.5 g/dL (ref 6.5–8.1)

## 2021-10-03 LAB — URINALYSIS, ROUTINE W REFLEX MICROSCOPIC
Bilirubin Urine: NEGATIVE
Glucose, UA: NEGATIVE mg/dL
Hgb urine dipstick: NEGATIVE
Ketones, ur: NEGATIVE mg/dL
Nitrite: NEGATIVE
Protein, ur: NEGATIVE mg/dL
Specific Gravity, Urine: 1.021 (ref 1.005–1.030)
pH: 5.5 (ref 5.0–8.0)

## 2021-10-03 LAB — CBC
HCT: 45.5 % (ref 39.0–52.0)
Hemoglobin: 15.3 g/dL (ref 13.0–17.0)
MCH: 31 pg (ref 26.0–34.0)
MCHC: 33.6 g/dL (ref 30.0–36.0)
MCV: 92.1 fL (ref 80.0–100.0)
Platelets: 273 10*3/uL (ref 150–400)
RBC: 4.94 MIL/uL (ref 4.22–5.81)
RDW: 13.3 % (ref 11.5–15.5)
WBC: 10.7 10*3/uL — ABNORMAL HIGH (ref 4.0–10.5)
nRBC: 0 % (ref 0.0–0.2)

## 2021-10-03 LAB — LIPASE, BLOOD: Lipase: 18 U/L (ref 11–51)

## 2021-10-03 MED ORDER — MORPHINE SULFATE (PF) 4 MG/ML IV SOLN
4.0000 mg | Freq: Once | INTRAVENOUS | Status: AC
  Administered 2021-10-03: 21:00:00 4 mg via INTRAVENOUS
  Filled 2021-10-03: qty 1

## 2021-10-03 MED ORDER — SODIUM CHLORIDE 0.9 % IV SOLN
1.0000 g | Freq: Once | INTRAVENOUS | Status: AC
  Administered 2021-10-03: 21:00:00 1 g via INTRAVENOUS
  Filled 2021-10-03: qty 10

## 2021-10-03 MED ORDER — ONDANSETRON HCL 4 MG/2ML IJ SOLN
4.0000 mg | Freq: Once | INTRAMUSCULAR | Status: AC
  Administered 2021-10-03: 21:00:00 4 mg via INTRAVENOUS
  Filled 2021-10-03: qty 2

## 2021-10-03 MED ORDER — TRAMADOL HCL 50 MG PO TABS: 50.0000 mg | ORAL_TABLET | Freq: Four times a day (QID) | ORAL | 0 refills | Status: AC | PRN

## 2021-10-03 MED ORDER — OXYCODONE-ACETAMINOPHEN 5-325 MG PO TABS
1.0000 | ORAL_TABLET | Freq: Once | ORAL | Status: AC
  Administered 2021-10-03: 22:00:00 1 via ORAL
  Filled 2021-10-03: qty 1

## 2021-10-03 MED ORDER — IOHEXOL 300 MG/ML  SOLN
100.0000 mL | Freq: Once | INTRAMUSCULAR | Status: AC | PRN
  Administered 2021-10-03: 19:00:00 100 mL via INTRAVENOUS

## 2021-10-03 MED ORDER — SODIUM CHLORIDE 0.9 % IV BOLUS
1000.0000 mL | Freq: Once | INTRAVENOUS | Status: AC
  Administered 2021-10-03: 21:00:00 1000 mL via INTRAVENOUS

## 2021-10-03 MED ORDER — CEPHALEXIN 500 MG PO CAPS: 1000.0000 mg | ORAL_CAPSULE | Freq: Two times a day (BID) | ORAL | 0 refills | Status: AC

## 2021-10-03 NOTE — ED Notes (Signed)
165mg  bladder scan

## 2021-10-03 NOTE — Discharge Instructions (Addendum)
It was our pleasure to provide your ER care today - we hope that you feel better.  Take antibiotic as prescribed. Take acetaminophen and/or ibuprofen as need for pain. You may also take ultram as need for pain - no driving for the next 6 hours, or when taking ultram.   Follow up with primary care doctor in one week for recheck.   Return to ER if worse, new symptoms, fevers, new, worsening or severe pain, or other concern.

## 2021-10-03 NOTE — ED Provider Notes (Signed)
[facility] EMERGENCY DEPT Provider Note   CSN: [id number] Arrival date & time: 10/03/21  1547     History Chief Complaint  Patient presents with   Pelvic Pain    Ronald Wagner is a 70 y.o. male.  Patient c/o lower abd pain, symptoms acute onset in past two days, constant, dull, moderate, non radiating. No hx same pain. No dysuria or hematuria. Feels as if is emptying bladder. Is having normal bms. No abd distension. No vomiting. No fever or chills. No hx prostatitis. Denies scrotal or testicular pain or swelling. No back/flank pain.   The history is provided by the patient and medical records.  Pelvic Pain Associated symptoms include abdominal pain. Pertinent negatives include no chest pain, no headaches and no shortness of breath.      Past Medical History:  Diagnosis Date   Hypertension     Patient Active Problem List   Diagnosis Date Noted   HEPATITIS C 02/11/2010   ALLERGIC RHINITIS 01/25/2009   ANXIETY DEPRESSION 02/11/2007   DISORDER, TOBACCO USE 02/11/2007   ANXIETY 02/01/2007   ALCOHOL ABUSE 02/01/2007   HYPERTENSION 02/01/2007   OLECRANON BURSITIS, RIGHT 02/01/2007   MYALGIA 02/01/2007    History reviewed. No pertinent surgical history.     No family history on file.  Social History   Tobacco Use   Smoking status: Every Day    Packs/day: 1.50    Types: Cigarettes   Smokeless tobacco: Never  Vaping Use   Vaping Use: Never used  Substance Use Topics   Alcohol use: Yes    Comment: 3-4 beers a day   Drug use: Not Currently    Types: Marijuana    Home Medications Prior to Admission medications   Medication Sig Start Date End Date Taking? Authorizing Provider  atorvastatin (LIPITOR) 10 MG tablet Take 10 mg by mouth at bedtime.    [provider]  chlorthalidone (HYGROTON) 25 MG tablet Take 25 mg by mouth daily. Take one-half tab daily.  Hold for BP < 100    [provider]  rivaroxaban (XARELTO) 20 MG TABS  tablet Take 1 tablet (20 mg total) by mouth daily with supper. 06/20/21   Couture, Cortni S, PA-C    Allergies    Penicillins  Review of Systems   Review of Systems  Constitutional:  Negative for chills and fever.  HENT:  Negative for sore throat.   Eyes:  Negative for redness.  Respiratory:  Negative for shortness of breath.   Cardiovascular:  Negative for chest pain.  Gastrointestinal:  Positive for abdominal pain. Negative for vomiting.  Genitourinary:  Positive for pelvic pain. Negative for dysuria and flank pain.  Musculoskeletal:  Negative for back pain.  Skin:  Negative for rash.  Neurological:  Negative for headaches.  Hematological:  Does not bruise/bleed easily.  Psychiatric/Behavioral:  Negative for confusion.    Physical Exam Updated Vital Signs BP 118/75    Pulse 72    Temp 99.9 F (37.7 C)    Resp 17    Ht 1.829 m (6')    Wt 93 kg    SpO2 96%    BMI 27.80 kg/m   Physical Exam Vitals and nursing note reviewed.  Constitutional:      Appearance: Normal appearance. He is well-developed.  HENT:     Head: Atraumatic.     Nose: Nose normal.     Mouth/Throat:     Mouth: Mucous membranes are moist.     Pharynx: Oropharynx  is clear.  Eyes:     General: No scleral icterus.    Conjunctiva/sclera: Conjunctivae normal.  Neck:     Trachea: No tracheal deviation.  Cardiovascular:     Rate and Rhythm: Normal rate and regular rhythm.     Pulses: Normal pulses.     Heart sounds: Normal heart sounds. No murmur heard.   No friction rub. No gallop.  Pulmonary:     Effort: Pulmonary effort is normal. No accessory muscle usage or respiratory distress.     Breath sounds: Normal breath sounds.  Abdominal:     General: Bowel sounds are normal. There is no distension.     Palpations: Abdomen is soft. There is no mass.     Tenderness: There is abdominal tenderness. There is no guarding or rebound.     Hernia: No hernia is present.     Comments: Lower abd tenderness.    Genitourinary:    Comments: No cva tenderness. Normal external gu exam, no scrotal or testicular pain, swelling or tenderness.  Musculoskeletal:        General: No swelling.     Cervical back: Normal range of motion and neck supple. No rigidity.  Skin:    General: Skin is warm and dry.     Findings: No rash.  Neurological:     Mental Status: He is alert.     Comments: Alert, speech clear.   Psychiatric:        Mood and Affect: Mood normal.    ED Results / Procedures / Treatments   Labs (all labs ordered are listed, but only abnormal results are displayed) Results for orders placed or performed during the hospital encounter of 10/03/21  Urinalysis, Routine w reflex microscopic Urine, Clean Catch  Result Value Ref Range   Color, Urine YELLOW YELLOW   APPearance CLEAR CLEAR   Specific Gravity, Urine 1.021 1.005 - 1.030   pH 5.5 5.0 - 8.0   Glucose, UA NEGATIVE NEGATIVE mg/dL   Hgb urine dipstick NEGATIVE NEGATIVE   Bilirubin Urine NEGATIVE NEGATIVE   Ketones, ur NEGATIVE NEGATIVE mg/dL   Protein, ur NEGATIVE NEGATIVE mg/dL   Nitrite NEGATIVE NEGATIVE   Leukocytes,Ua TRACE (A) NEGATIVE   WBC, UA 0-5 0 - 5 WBC/hpf  Lipase, blood  Result Value Ref Range   Lipase 18 11 - 51 U/L  Comprehensive metabolic panel  Result Value Ref Range   Sodium 137 135 - 145 mmol/L   Potassium 3.4 (L) 3.5 - 5.1 mmol/L   Chloride 99 98 - 111 mmol/L   CO2 30 22 - 32 mmol/L   Glucose, Bld 149 (H) 70 - 99 mg/dL   BUN 16 8 - 23 mg/dL   Creatinine, Ser 1.96 0.61 - 1.24 mg/dL   Calcium 9.6 8.9 - 22.2 mg/dL   Total Protein 7.5 6.5 - 8.1 g/dL   Albumin 4.1 3.5 - 5.0 g/dL   AST 18 15 - 41 U/L   ALT 17 0 - 44 U/L   Alkaline Phosphatase 74 38 - 126 U/L   Total Bilirubin 0.5 0.3 - 1.2 mg/dL   GFR, Estimated >97 >98 mL/min   Anion gap 8 5 - 15  CBC  Result Value Ref Range   WBC 10.7 (H) 4.0 - 10.5 K/uL   RBC 4.94 4.22 - 5.81 MIL/uL   Hemoglobin 15.3 13.0 - 17.0 g/dL   HCT 92.1 19.4 - 17.4 %   MCV  92.1 80.0 - 100.0 fL   MCH 31.0 26.0 -  34.0 pg   MCHC 33.6 30.0 - 36.0 g/dL   RDW 13.3 11.5 - 15.5 %   Platelets 273 150 - 400 K/uL   nRBC 0.0 0.0 - 0.2 %    EKG None  Radiology CT Abdomen Pelvis W Contrast  Result Date: 10/03/2021 CLINICAL DATA:  Central pelvic pain EXAM: CT ABDOMEN AND PELVIS WITH CONTRAST TECHNIQUE: Multidetector CT imaging of the abdomen and pelvis was performed using the standard protocol following bolus administration of intravenous contrast. CONTRAST:  180mL OMNIPAQUE IOHEXOL 300 MG/ML  SOLN COMPARISON:  CT report 02/06/2005 FINDINGS: Lower chest: Lung bases demonstrate no acute consolidation or effusion. There is mild cardiomegaly. Hepatobiliary: No focal liver abnormality is seen. No gallstones, gallbladder wall thickening, or biliary dilatation. Pancreas: Unremarkable. No pancreatic ductal dilatation or surrounding inflammatory changes. Spleen: Normal in size without focal abnormality. Adrenals/Urinary Tract: Adrenal glands are normal. Kidneys show no hydronephrosis. Slightly thick-walled appearance of urinary bladder. Mild perivesical soft tissue stranding anteriorly. Stomach/Bowel: Stomach is within normal limits. Appendix appears normal. No evidence of bowel wall thickening, distention, or inflammatory changes. 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 Vascular/Lymphatic: Moderate aortic atherosclerosis without aneurysm. No suspicious lymph nodes Reproductive: Prostate is unremarkable. Other: Negative for pelvic effusion or free air. Musculoskeletal: No acute or suspicious osseous abnormality IMPRESSION: 1. Mild bladder wall thickening with perivesical soft tissue stranding anteriorly, possible cystitis. Correlate with urinalysis. 2. Otherwise no CT evidence for acute intra-abdominal or pelvic abnormality 3. Diverticular disease of the left colon without acute inflammatory process Electronically Signed   By: Donavan Foil M.D.   On: 10/03/2021  19:35    Procedures Procedures   Medications Ordered in ED Medications - No data to display  ED Course  I have reviewed the triage vital signs and the nursing notes.  Pertinent labs & imaging results that were available during my care of the patient were reviewed by me and considered in my medical decision making (see chart for details).    MDM Rules/Calculators/A&P                         Labs sent.  Iv ns. Imaging ordered.   Reviewed nursing notes and prior charts for additional history.   Labs reviewed/interpreted by me - wbc sl elevated. Ua w tr leucs.   CT reviewed/interpreted by me - no acute process, except possible cystitis. ?whether prostatitis/uti as predominant symptom is pelvic area pain, and suprapubic tenderness. On repeat exam, there is no abd wall, perineal, scrotal, or perirectal skin changes, tenderness, or sts. No perirectal abscess. Prostate is not exquisitely tender.    Pt  requests pain med. Morphine iv.   Ns bolus iv. Rocephin iv.   Recheck, pt appears more comfortable, no distress, vitals normal.   Pt currently appears stable for d/c.   Rec pcp f/u.  Return precautions provided.        Final Clinical Impression(s) / ED Diagnoses Final diagnoses:  None    Rx / DC Orders ED Discharge Orders     None        Lajean Saver, MD 10/03/21 2048

## 2021-10-03 NOTE — ED Triage Notes (Signed)
Patient reports central pelvic pain that started yesterday. Patient denies urinary symptoms. Patient denies falls. Denies blood in his urine.

## 2021-10-05 LAB — URINE CULTURE: Culture: <10000 — AB
